# Patient Record
Sex: Female | Born: 1980 | Race: White | Hispanic: No | Marital: Married | Smoking: Never smoker
Health system: Southern US, Community
[De-identification: ages and names within clinical notes are randomized; demographics above are authoritative.]

## PROBLEM LIST (undated history)

## (undated) DIAGNOSIS — N840 Polyp of corpus uteri: Secondary | ICD-10-CM

## (undated) DIAGNOSIS — Z803 Family history of malignant neoplasm of breast: Secondary | ICD-10-CM

## (undated) DIAGNOSIS — J309 Allergic rhinitis, unspecified: Secondary | ICD-10-CM

## (undated) DIAGNOSIS — Z1371 Encounter for nonprocreative screening for genetic disease carrier status: Secondary | ICD-10-CM

## (undated) DIAGNOSIS — E039 Hypothyroidism, unspecified: Secondary | ICD-10-CM

## (undated) DIAGNOSIS — K219 Gastro-esophageal reflux disease without esophagitis: Secondary | ICD-10-CM

## (undated) DIAGNOSIS — Z8041 Family history of malignant neoplasm of ovary: Secondary | ICD-10-CM

## (undated) DIAGNOSIS — N923 Ovulation bleeding: Secondary | ICD-10-CM

## (undated) HISTORY — DX: Encounter for nonprocreative screening for genetic disease carrier status: Z13.71

## (undated) HISTORY — DX: Family history of malignant neoplasm of breast: Z80.3

## (undated) HISTORY — DX: Family history of malignant neoplasm of ovary: Z80.41

## (undated) HISTORY — PX: TONSILLECTOMY: SUR1361

## (undated) HISTORY — DX: Polyp of corpus uteri: N84.0

## (undated) HISTORY — DX: Allergic rhinitis, unspecified: J30.9

## (undated) HISTORY — DX: Ovulation bleeding: N92.3

---

## 2005-03-14 ENCOUNTER — Ambulatory Visit: Payer: Self-pay | Admitting: Obstetrics & Gynecology

## 2005-05-05 ENCOUNTER — Observation Stay: Payer: Self-pay | Admitting: Obstetrics & Gynecology

## 2005-05-20 ENCOUNTER — Inpatient Hospital Stay: Payer: Self-pay | Admitting: Unknown Physician Specialty

## 2006-02-28 ENCOUNTER — Emergency Department: Payer: Self-pay | Admitting: Emergency Medicine

## 2012-07-22 HISTORY — PX: COLPOSCOPY: SHX161

## 2014-09-12 ENCOUNTER — Emergency Department: Payer: Self-pay | Admitting: Emergency Medicine

## 2015-08-03 ENCOUNTER — Encounter
Admission: RE | Admit: 2015-08-03 | Discharge: 2015-08-03 | Disposition: A | Discharge status: Home / Self Care | Payer: BLUE CROSS/BLUE SHIELD | Source: Ambulatory Visit | Attending: Obstetrics and Gynecology | Admitting: Obstetrics and Gynecology

## 2015-08-03 ENCOUNTER — Other Ambulatory Visit: Payer: Self-pay

## 2015-08-03 DIAGNOSIS — Z01812 Encounter for preprocedural laboratory examination: Secondary | ICD-10-CM | POA: Diagnosis not present

## 2015-08-03 HISTORY — DX: Hypothyroidism, unspecified: E03.9

## 2015-08-03 HISTORY — DX: Gastro-esophageal reflux disease without esophagitis: K21.9

## 2015-08-03 LAB — CBC
HEMATOCRIT: 34.4 % — AB (ref 35.0–47.0)
Hemoglobin: 10.9 g/dL — ABNORMAL LOW (ref 12.0–16.0)
MCH: 23.6 pg — ABNORMAL LOW (ref 26.0–34.0)
MCHC: 31.7 g/dL — ABNORMAL LOW (ref 32.0–36.0)
MCV: 74.7 fL — AB (ref 80.0–100.0)
Platelets: 281 10*3/uL (ref 150–440)
RBC: 4.61 MIL/uL (ref 3.80–5.20)
RDW: 16.6 % — ABNORMAL HIGH (ref 11.5–14.5)
WBC: 8 10*3/uL (ref 3.6–11.0)

## 2015-08-03 NOTE — Patient Instructions (Signed)
    Your procedure is scheduled on: Thursday 08/10/15 Report to Day Surgery. 2ND FLOOR MEDICAL MALL ENTRANCE To find out your arrival time please call [phone] between 1PM - 3PM on Wednesday 08/09/15.  Remember: Instructions that are not followed completely may result in serious medical risk, up to and including death, or upon the discretion of your surgeon and anesthesiologist your surgery may need to be rescheduled.    __X__ 1. Do not eat food or drink liquids after midnight. No gum chewing or hard candies.     __X__ 2. No Alcohol for 24 hours before or after surgery.   ____ 3. Bring all medications with you on the day of surgery if instructed.    __X__ 4. Notify your doctor if there is any change in your medical condition     (cold, fever, infections).     Do not wear jewelry, make-up, hairpins, clips or nail polish.  Do not wear lotions, powders, or perfumes. You may wear deodorant.  Do not shave 48 hours prior to surgery. Men may shave face and neck.  Do not bring valuables to the hospital.    [facility] is not responsible for any belongings or valuables.               Contacts, dentures or bridgework may not be worn into surgery.  Leave your suitcase in the car. After surgery it may be brought to your room.  For patients admitted to the hospital, discharge time is determined by your                treatment team.   Patients discharged the day of surgery will not be allowed to drive home.   Please read over the following fact sheets that you were given:   Surgical Site Infection Prevention   __X__ Take these medicines the morning of surgery with A SIP OF WATER:    1. CETIRIZINE/ZYRTEC  2. LEVOTHYROXINE/SYNTHROID  3. RANITIDINE/ZANTAC  4.  5.  6.  ____ Fleet Enema (as directed)   ____ Use CHG Soap as directed  ____ Use inhalers on the day of surgery  ____ Stop metformin 2 days prior to surgery    ____ Take 1/2 of usual insulin dose the night before surgery and  none on the morning of surgery.   ____ Stop Coumadin/Plavix/aspirin on   ____ Stop Anti-inflammatories on TYLENOL ONLY FOR ACHES OR PAINS   ____ Stop supplements until after surgery.    ____ Bring C-Pap to the hospital.

## 2015-08-03 NOTE — Pre-Procedure Instructions (Signed)
PCR result +Staph, result faxed to Dr. Wyn Quakerew, verbal notification to Vernona RiegerLaura

## 2015-08-10 ENCOUNTER — Ambulatory Visit
Admission: RE | Admit: 2015-08-10 | Discharge: 2015-08-10 | Disposition: A | Discharge status: Home / Self Care | Payer: BLUE CROSS/BLUE SHIELD | Source: Ambulatory Visit | Attending: Obstetrics and Gynecology | Admitting: Obstetrics and Gynecology

## 2015-08-10 ENCOUNTER — Ambulatory Visit: Payer: BLUE CROSS/BLUE SHIELD | Admitting: Anesthesiology

## 2015-08-10 ENCOUNTER — Encounter
Admission: RE | Disposition: A | Discharge status: Home / Self Care | Payer: Self-pay | Source: Ambulatory Visit | Attending: Obstetrics and Gynecology

## 2015-08-10 DIAGNOSIS — E039 Hypothyroidism, unspecified: Secondary | ICD-10-CM | POA: Diagnosis not present

## 2015-08-10 DIAGNOSIS — K219 Gastro-esophageal reflux disease without esophagitis: Secondary | ICD-10-CM | POA: Insufficient documentation

## 2015-08-10 DIAGNOSIS — Z9889 Other specified postprocedural states: Secondary | ICD-10-CM

## 2015-08-10 DIAGNOSIS — N840 Polyp of corpus uteri: Secondary | ICD-10-CM | POA: Diagnosis present

## 2015-08-10 HISTORY — PX: HYSTEROSCOPY WITH D & C: SHX1775

## 2015-08-10 LAB — POCT PREGNANCY, URINE: PREG TEST UR: NEGATIVE

## 2015-08-10 SURGERY — DILATATION AND CURETTAGE /HYSTEROSCOPY
Anesthesia: General | Wound class: Clean Contaminated

## 2015-08-10 MED ORDER — DEXAMETHASONE SODIUM PHOSPHATE 10 MG/ML IJ SOLN
INTRAMUSCULAR | Status: DC | PRN
  Administered 2015-08-10: 10 mg via INTRAVENOUS

## 2015-08-10 MED ORDER — FENTANYL CITRATE (PF) 100 MCG/2ML IJ SOLN: 25.0000 ug | INTRAMUSCULAR | Status: DC | PRN

## 2015-08-10 MED ORDER — OXYCODONE HCL 5 MG/5ML PO SOLN: 5.0000 mg | Freq: Once | ORAL | Status: DC | PRN

## 2015-08-10 MED ORDER — PROPOFOL 10 MG/ML IV BOLUS
INTRAVENOUS | Status: DC | PRN
  Administered 2015-08-10: 150 mg via INTRAVENOUS

## 2015-08-10 MED ORDER — FENTANYL CITRATE (PF) 100 MCG/2ML IJ SOLN
INTRAMUSCULAR | Status: DC | PRN
  Administered 2015-08-10: 50 ug via INTRAVENOUS

## 2015-08-10 MED ORDER — MIDAZOLAM HCL 2 MG/2ML IJ SOLN
INTRAMUSCULAR | Status: DC | PRN
Start: 2015-08-10 — End: 2015-08-10
  Administered 2015-08-10: 2 mg via INTRAVENOUS

## 2015-08-10 MED ORDER — HYDROCODONE-ACETAMINOPHEN 5-325 MG PO TABS: 1.0000 | ORAL_TABLET | Freq: Four times a day (QID) | ORAL | Status: DC | PRN

## 2015-08-10 MED ORDER — EPHEDRINE SULFATE 50 MG/ML IJ SOLN
INTRAMUSCULAR | Status: DC | PRN
  Administered 2015-08-10: 10 mg via INTRAVENOUS

## 2015-08-10 MED ORDER — LIDOCAINE HCL (CARDIAC) 20 MG/ML IV SOLN
INTRAVENOUS | Status: DC | PRN
  Administered 2015-08-10: 60 mg via INTRAVENOUS

## 2015-08-10 MED ORDER — IBUPROFEN 600 MG PO TABS: 600.0000 mg | ORAL_TABLET | Freq: Four times a day (QID) | ORAL | Status: DC | PRN

## 2015-08-10 MED ORDER — OXYCODONE HCL 5 MG PO TABS: 5.0000 mg | ORAL_TABLET | Freq: Once | ORAL | Status: DC | PRN

## 2015-08-10 MED ORDER — LACTATED RINGERS IV SOLN
INTRAVENOUS | Status: DC
  Administered 2015-08-10: 09:00:00 via INTRAVENOUS

## 2015-08-10 MED ORDER — ONDANSETRON HCL 4 MG/2ML IJ SOLN
INTRAMUSCULAR | Status: DC | PRN
  Administered 2015-08-10: 4 mg via INTRAVENOUS

## 2015-08-10 SURGICAL SUPPLY — 18 items
CATH ROBINSON RED A/P 16FR (CATHETERS) ×3 IMPLANT
ELECT REM PT RETURN 9FT ADLT (ELECTROSURGICAL) ×3
ELECTRODE REM PT RTRN 9FT ADLT (ELECTROSURGICAL) ×1 IMPLANT
GLOVE BIO SURGEON STRL SZ7 (GLOVE) ×9 IMPLANT
GLOVE INDICATOR 7.5 STRL GRN (GLOVE) ×9 IMPLANT
GOWN STRL REUS W/ TWL LRG LVL3 (GOWN DISPOSABLE) ×2 IMPLANT
GOWN STRL REUS W/TWL LRG LVL3 (GOWN DISPOSABLE) ×4
IV LACTATED RINGERS 1000ML (IV SOLUTION) ×3 IMPLANT
KIT RM TURNOVER CYSTO AR (KITS) ×3 IMPLANT
MYOSURE LITE POLYP REMOVAL (MISCELLANEOUS) ×3 IMPLANT
PACK DNC HYST (MISCELLANEOUS) ×3 IMPLANT
PAD OB MATERNITY 4.3X12.25 (PERSONAL CARE ITEMS) ×3 IMPLANT
PAD PREP 24X41 OB/GYN DISP (PERSONAL CARE ITEMS) ×3 IMPLANT
SOL .9 NS 3000ML IRR  AL (IV SOLUTION) ×2
SOL .9 NS 3000ML IRR UROMATIC (IV SOLUTION) ×1 IMPLANT
TOWEL OR 17X26 4PK STRL BLUE (TOWEL DISPOSABLE) ×3 IMPLANT
TUBING CONNECTING 10 (TUBING) ×2 IMPLANT
TUBING CONNECTING 10' (TUBING) ×1

## 2015-08-10 NOTE — Discharge Instructions (Signed)

## 2015-08-10 NOTE — Op Note (Signed)
Patient Name: Kelly Villanueva Date of Procedure: 08/10/2015   Preoperative Diagnosis: 1) 35 y.o. with endometrial polyp  Postoperative Diagnosis: 1) 35 y.o. with endometrial polyp  Operation Performed: Hysteroscopy, targeted dilation and curettage polypectomy with myosure  Indication: 35 year old who on in office evaluation for irregular menses noted to have endometrial polyp on imaging  Anesthesia:General  Primary Surgeon: Vena Austria, MD  Assistant: none  Preoperative Antibiotics: none  Estimated Blood Loss: minimal  IV Fluids:  Urine Output:: ~84mL straiggt cath  Drains or Tubes: none  Implants: none  Specimens Removed: endometrial curettings and polyp  Complications: none  Intraoperative Findings:  Small 1cm polyp arising from the mid left lateral aspect of the uterine cavity  Patient Condition: stable  Procedure in Detail:  Patient was taken to the operating room were she was administered general endotracheal anesthesia.  She was positioned in the dorsal lithotomy position utilizing Allen stirups, prepped and draped in the usual sterile fashion.  Uterus was noted to be non-enlarged in size, retroverted.   Prior to proceeding with the case a time out was performed.  Attention was turned to the patient's pelvis.  A red rubber catheter was used to empty the patient's bladder.  An operative speculum was placed to allow visualization of the cervix.  The anterior lip of the cervix was grasped with a single tooth tenaculum and the cervix was sequentially dilated using pratt dilators.  The hysteroscope was then advanced into the uterine cavity noting the above findings.  The myosure system was used to shave off the visualized polyp, the remainder of the cavity was normal in contour.  Targeted curettage was performed ausing the my0sure system to sample the remainder of the uterine lining. The single tooth tenaculum was removed from the cervix.  The tenaculum sites  and cervix were noted to be  Hemostatic before removing the operative speculum.  Sponge needle and instrument counts were corrects times two.  The patient tolerated the procedure well and was taken to the recovery room in stable condition.

## 2015-08-10 NOTE — Anesthesia Postprocedure Evaluation (Signed)
Anesthesia Post Note  Patient: Kelly Villanueva  Procedure(s) Performed: Procedure(s) (LRB): DILATATION AND CURETTAGE /HYSTEROSCOPY with polypectomy/myosure (N/A)  Patient location during evaluation: PACU Anesthesia Type: General Level of consciousness: awake and alert Pain management: pain level controlled Vital Signs Assessment: post-procedure vital signs reviewed and stable Respiratory status: spontaneous breathing, nonlabored ventilation, respiratory function stable and patient connected to nasal cannula oxygen Cardiovascular status: blood pressure returned to baseline and stable Postop Assessment: no signs of nausea or vomiting Anesthetic complications: no    Last Vitals:  Filed Vitals:   08/10/15 1023 08/10/15 1032  BP: 115/76 119/72  Pulse: 91 81  Temp: 36.8 C 36.5 C  Resp: 24 18    Last Pain: There were no vitals filed for this visit.               Cleda Mccreedy Piscitello

## 2015-08-10 NOTE — Transfer of Care (Signed)
Immediate Anesthesia Transfer of Care Note  Patient: Kelly Villanueva  Procedure(s) Performed: Procedure(s): DILATATION AND CURETTAGE /HYSTEROSCOPY with polypectomy/myosure (N/A)  Patient Location: PACU  Anesthesia Type:General  Level of Consciousness: awake, alert  and oriented  Airway & Oxygen Therapy: Patient Spontanous Breathing and Patient connected to face mask oxygen  Post-op Assessment: Report given to RN and Post -op Vital signs reviewed and stable  Post vital signs: Reviewed and stable  Last Vitals: 100% sat 119/79 bp 18resp 117hr 98.3temp Filed Vitals:   08/10/15 0827  BP: 125/76  Pulse: 84  Temp: 36.3 C  Resp: 16    Complications: No apparent anesthesia complications

## 2015-08-10 NOTE — Pre-Procedure Instructions (Signed)
PCR result +Staph, result faxed to Dr. Wyn Quaker, verbal notification to Vernona Rieger Addendum: note entered on wrong patient

## 2015-08-10 NOTE — Anesthesia Procedure Notes (Signed)
Procedure Name: LMA Insertion Date/Time: 08/10/2015 8:55 AM Performed by: Chong Sicilian Pre-anesthesia Checklist: Patient identified, Emergency Drugs available, Suction available, Patient being monitored and Timeout performed Patient Re-evaluated:Patient Re-evaluated prior to inductionOxygen Delivery Method: Circle system utilized and Simple face mask Preoxygenation: Pre-oxygenation with 100% oxygen Intubation Type: IV induction Ventilation: Mask ventilation without difficulty LMA Size: 3.5 Placement Confirmation: breath sounds checked- equal and bilateral and positive ETCO2 Tube secured with: Tape

## 2015-08-10 NOTE — Anesthesia Preprocedure Evaluation (Signed)
Anesthesia Evaluation  Patient identified by MRN, date of birth, ID band Patient awake    Reviewed: Allergy & Precautions, H&P , NPO status , Patient's Chart, lab work & pertinent test results  History of Anesthesia Complications Negative for: history of anesthetic complications  Airway Mallampati: II  TM Distance: >3 FB Neck ROM: full    Dental  (+) Poor Dentition   Pulmonary neg pulmonary ROS, neg shortness of breath,    Pulmonary exam normal breath sounds clear to auscultation       Cardiovascular Exercise Tolerance: Good (-) angina(-) Past MI and (-) DOE negative cardio ROS Normal cardiovascular exam Rhythm:regular Rate:Normal     Neuro/Psych negative neurological ROS  negative psych ROS   GI/Hepatic Neg liver ROS, GERD  Controlled and Medicated,  Endo/Other  Hypothyroidism   Renal/GU negative Renal ROS  negative genitourinary   Musculoskeletal   Abdominal   Peds  Hematology negative hematology ROS (+)   Anesthesia Other Findings Past Medical History:   Hypothyroidism                                               GERD (gastroesophageal reflux disease)                      Past Surgical History:   TONSILLECTOMY                                                BMI    Body Mass Index   31.00 kg/m 2    Signs and symptoms suggestive of sleep apnea   Patient has abrasion on top lip  Reproductive/Obstetrics negative OB ROS                             Anesthesia Physical Anesthesia Plan  ASA: III  Anesthesia Plan: General LMA   Post-op Pain Management:    Induction:   Airway Management Planned:   Additional Equipment:   Intra-op Plan:   Post-operative Plan:   Informed Consent: I have reviewed the patients History and Physical, chart, labs and discussed the procedure including the risks, benefits and alternatives for the proposed anesthesia with the patient or authorized  representative who has indicated his/her understanding and acceptance.   Dental Advisory Given  Plan Discussed with: Anesthesiologist, CRNA and Surgeon  Anesthesia Plan Comments:         Anesthesia Quick Evaluation

## 2015-08-10 NOTE — H&P (Signed)
  Date of Initial H&P: 07/11/2015  History reviewed, patient examined, no change in status, stable for surgery. 

## 2015-08-10 NOTE — Pre-Procedure Instructions (Deleted)
Addendum: note dated 08/03/15 entered on wrong patient

## 2015-08-11 LAB — SURGICAL PATHOLOGY

## 2017-03-22 DIAGNOSIS — Z1371 Encounter for nonprocreative screening for genetic disease carrier status: Secondary | ICD-10-CM

## 2017-03-22 HISTORY — DX: Encounter for nonprocreative screening for genetic disease carrier status: Z13.71

## 2017-03-30 NOTE — Progress Notes (Addendum)
Gynecology Annual Exam  PCP: Albina Billet, MD  Chief Complaint:  Chief Complaint  Patient presents with  . Gynecologic Exam    History of Present Illness:Kelly Villanueva is a 36 year old Caucasian/White female, G3 P3003, who presents for her gynecological  exam. She continues to have intermenstrual spotting the week before her menses starts. The spotting is more of a brown discharge that she sees when wiping. Her spotting has decreased since having a hysteroscopy and endometrial polypectomy Jan 2017. The pathology was benign. SHe will occsionally have postcoital spotting. Her menses are regular and her LMP was 03/18/2017. They occur every 1 month, they last 7 days, are medium flow, and are without clots.    She has not had dysmenorrhea. The patient's past medical history is notable for a history of hypothyroidism and has been followed by PCP Dr Hall Busing. \  Her last annual GYN exam was 02/24/2015. She is sexually active. She is currently using a vasectomy for contraception.  Her most recent pap smear was obtained 02/24/2015 and was NIL.  She has not had a recent mammogram and is eligible.  There is a positive history of breast cancer in her maternal grandmother.  There is a family history of ovarian cancer in her paternal grandmother . Genetic testing has been done. Patient reports that her sister had genetic testing done and it was positive for mutations increasing the risk of pancreatic cancer.  The patient does do occasional self breast exams.  The patient does not smoke.  The patient does not drink alcohol.  The patient does not use illegal drugs.  The patient exercises regularly.  The patient does get adequate calcium in her diet.  She has had a recent cholesterol screen 2018 and it was noraml.    The patient denies current symptoms of depression.    Review of Systems: Review of Systems  Constitutional: Negative for chills, fever and weight loss.  HENT: Negative for  congestion, sinus pain and sore throat.   Eyes: Negative for blurred vision and pain.  Respiratory: Negative for hemoptysis, shortness of breath and wheezing.   Cardiovascular: Negative for chest pain, palpitations and leg swelling.  Gastrointestinal: Negative for abdominal pain, blood in stool, diarrhea, heartburn, nausea and vomiting.  Genitourinary: Negative for dysuria, frequency, hematuria and urgency.       Positive for intermenstrual spotting  Musculoskeletal: Negative for back pain, joint pain and myalgias.  Skin: Negative for itching and rash.  Neurological: Negative for dizziness, tingling and headaches.  Endo/Heme/Allergies: Negative for environmental allergies and polydipsia. Does not bruise/bleed easily.       Negative for hirsutism   Psychiatric/Behavioral: Negative for depression. The patient is not nervous/anxious and does not have insomnia.     Past Medical History:  Past Medical History:  Diagnosis Date  . Allergic rhinitis   . Endometrial polyp   . Family history of breast cancer   . Family history of ovarian cancer   . GERD (gastroesophageal reflux disease)   . Hypothyroidism   . Intermenstrual bleeding     Past Surgical History:  Past Surgical History:  Procedure Laterality Date  . COLPOSCOPY  2014  . HYSTEROSCOPY W/D&C N/A 08/10/2015   Procedure: DILATATION AND CURETTAGE /HYSTEROSCOPY with polypectomy/myosure;  Surgeon: Malachy Mood, MD;  Location: [facility] ORS;  Service: Gynecology;  Laterality: N/A;  . TONSILLECTOMY      Family History:  Family History  Problem Relation Age of Onset  . Thyroid disease Mother   .  Anemia Mother   . Arthritis/Rheumatoid Mother   . Thyroid disease Maternal Aunt   . Arthritis/Rheumatoid Maternal Aunt   . Breast cancer Maternal Grandmother 40       recurrence in her 99s  . Thyroid disease Maternal Grandmother   . Diabetes Maternal Grandmother   . Melanoma Maternal Grandmother   . Heart attack Maternal Grandmother     . Stroke Maternal Grandmother   . Cancer Paternal Grandmother 103       ovarian  . Diabetes Paternal Grandfather   . Hypertension Father   . Hyperlipidemia Father   . Melanoma Father   . Multiple myeloma Maternal Grandfather     Social History:  Social History   Social History  . Marital status: Married    Spouse name: Josh  . Number of children: 3  . Years of education: N/A   Occupational History  . Teacher    Social History Main Topics  . Smoking status: Never Smoker  . Smokeless tobacco: Never Used  . Alcohol use No  . Drug use: No  . Sexual activity: Yes    Birth control/ protection: Surgical     Comment: vasectomy   Other Topics Concern  . Not on file   Social History Narrative  . No narrative on file    Allergies:  No Known Allergies  Medications: Prior to Admission medications   Medication Sig Start Date End Date Taking? Authorizing Provider  cetirizine (ZYRTEC) 10 MG tablet Take 10 mg by mouth daily. Reported on 08/10/2015    [provider]                levothyroxine (SYNTHROID, LEVOTHROID) 100 MCG tablet Take 100 mcg by mouth daily before breakfast.    [provider]  ranitidine (ZANTAC) 150 MG tablet Take 150 mg by mouth daily as needed for heartburn.    [provider]  Flonase prn  Physical Exam Vitals: BP 110/80   Pulse (!) 108   Ht _0  (1.6 m)   Wt 176 lb (79.8 kg)   LMP 03/17/2017   BMI 31.18 kg/m   General: WF in NAD HEENT: normocephalic, anicteric Neck: no thyroid enlargement, no palpable nodules, no cervical lymphadenopathy  Pulmonary: No increased work of breathing, CTAB Cardiovascular: RRR, without murmur  Breast: Breast symmetrical, no tenderness, no palpable nodules or masses, no skin or nipple retraction present, no nipple discharge.  No axillary, infraclavicular or supraclavicular lymphadenopathy. Abdomen: Soft, non-tender, non-distended.  Umbilicus without lesions.  No hepatomegaly or masses  palpable. No evidence of hernia. Genitourinary:  External: Normal external female genitalia.  Normal urethral meatus, normal Bartholin's and Skene's glands.    Vagina: Normal vaginal mucosa, no evidence of prolapse.    Cervix: friable area at 5-6 o'clock-treated with silver nitrate,  non-tender  Uterus: Anteverted, normal size, shape, and consistency, mobile, and non-tender  Adnexa: No adnexal masses, non-tender  Rectal: deferred  Lymphatic: no evidence of inguinal lymphadenopathy Extremities: no edema, erythema, or tenderness Neurologic: Grossly intact Psychiatric: mood appropriate, affect full     Assessment: 36 y.o. G3 P3 annual gyn exam Family history of ovarian and breast cancer-desires genetic testing Intermenstrual spotting/ friable cervix  Plan:   1) Breast cancer screening - recommend monthly self breast exam and will probably need to start breast imaging Will do genetic testing and have patient return in 6 weeks for results and TC model and recommendations for imaging.Marland Kitchen  2) Cervical cancer screening - Pap was done. ASCCP guidelines  and rational discussed.  Patient opts for yearly screening interval  3) Contraception - vasectomy  4) Routine healthcare maintenance including cholesterol and diabetes screening managed by PCP   Dalia Heading, CNM

## 2017-03-31 ENCOUNTER — Encounter: Payer: Self-pay | Admitting: Certified Nurse Midwife

## 2017-03-31 ENCOUNTER — Ambulatory Visit (INDEPENDENT_AMBULATORY_CARE_PROVIDER_SITE_OTHER): Payer: BLUE CROSS/BLUE SHIELD | Admitting: Certified Nurse Midwife

## 2017-03-31 VITALS — BP 110/80 | HR 108 | Ht 63.0 in | Wt 176.0 lb

## 2017-03-31 DIAGNOSIS — Z803 Family history of malignant neoplasm of breast: Secondary | ICD-10-CM | POA: Diagnosis not present

## 2017-03-31 DIAGNOSIS — N923 Ovulation bleeding: Secondary | ICD-10-CM | POA: Diagnosis not present

## 2017-03-31 DIAGNOSIS — N840 Polyp of corpus uteri: Secondary | ICD-10-CM

## 2017-03-31 DIAGNOSIS — Z8041 Family history of malignant neoplasm of ovary: Secondary | ICD-10-CM | POA: Diagnosis not present

## 2017-03-31 DIAGNOSIS — Z01419 Encounter for gynecological examination (general) (routine) without abnormal findings: Secondary | ICD-10-CM | POA: Diagnosis not present

## 2017-03-31 DIAGNOSIS — Z124 Encounter for screening for malignant neoplasm of cervix: Secondary | ICD-10-CM

## 2017-04-04 LAB — IGP, APTIMA HPV
HPV Aptima: NEGATIVE
PAP SMEAR COMMENT: 0

## 2017-04-06 ENCOUNTER — Encounter: Payer: Self-pay | Admitting: Certified Nurse Midwife

## 2017-04-06 DIAGNOSIS — E039 Hypothyroidism, unspecified: Secondary | ICD-10-CM | POA: Insufficient documentation

## 2017-04-06 DIAGNOSIS — Z803 Family history of malignant neoplasm of breast: Secondary | ICD-10-CM | POA: Insufficient documentation

## 2017-04-06 DIAGNOSIS — J309 Allergic rhinitis, unspecified: Secondary | ICD-10-CM | POA: Insufficient documentation

## 2017-04-06 DIAGNOSIS — Z8041 Family history of malignant neoplasm of ovary: Secondary | ICD-10-CM | POA: Insufficient documentation

## 2017-04-06 DIAGNOSIS — N840 Polyp of corpus uteri: Secondary | ICD-10-CM | POA: Insufficient documentation

## 2017-04-06 DIAGNOSIS — K219 Gastro-esophageal reflux disease without esophagitis: Secondary | ICD-10-CM | POA: Insufficient documentation

## 2017-04-06 DIAGNOSIS — N923 Ovulation bleeding: Secondary | ICD-10-CM | POA: Insufficient documentation

## 2017-04-07 ENCOUNTER — Encounter: Payer: Self-pay | Admitting: Certified Nurse Midwife

## 2017-04-14 ENCOUNTER — Encounter: Payer: Self-pay | Admitting: Obstetrics and Gynecology

## 2017-05-12 ENCOUNTER — Ambulatory Visit (INDEPENDENT_AMBULATORY_CARE_PROVIDER_SITE_OTHER): Payer: BLUE CROSS/BLUE SHIELD | Admitting: Certified Nurse Midwife

## 2017-05-12 ENCOUNTER — Encounter: Payer: Self-pay | Admitting: Certified Nurse Midwife

## 2017-05-12 VITALS — BP 124/82 | HR 91 | Ht 63.0 in | Wt 169.0 lb

## 2017-05-12 DIAGNOSIS — Z803 Family history of malignant neoplasm of breast: Secondary | ICD-10-CM | POA: Diagnosis not present

## 2017-05-12 DIAGNOSIS — Z7183 Encounter for nonprocreative genetic counseling: Secondary | ICD-10-CM | POA: Diagnosis not present

## 2017-05-12 DIAGNOSIS — Z8041 Family history of malignant neoplasm of ovary: Secondary | ICD-10-CM

## 2017-05-18 NOTE — Progress Notes (Signed)
Obstetrics & Gynecology Office Visit   Chief Complaint:  Chief Complaint  Patient presents with  . Follow-up    myriad test results    History of Present Illness: 36 year old WF who presents for counseling on results after having MYRISK testing because of her family history of ovarian cancerIn her paternal grandmother and breast cancer in her 17 yo maternal grandmother. The test returned negative for any significant genetic mutations that would increase her rsik of cancer. 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 Her lifetime risk of breast cancer is 17.5%   Review of Systems:  ROS  NA  Past Medical History:  Past Medical History:  Diagnosis Date  . Allergic rhinitis   . BRCA negative 03/2017   MyRisk neg  . Endometrial polyp   . Family history of breast cancer    IBIS=16.6%/riskscore=17.5%  . Family history of ovarian cancer    PGM  . GERD (gastroesophageal reflux disease)   . Hypothyroidism   . Intermenstrual bleeding     Past Surgical History:  Past Surgical History:  Procedure Laterality Date  . COLPOSCOPY  2014  . HYSTEROSCOPY W/D&C N/A 08/10/2015   Procedure: DILATATION AND CURETTAGE /HYSTEROSCOPY with polypectomy/myosure;  Surgeon: Malachy Mood, MD;  Location: [facility] ORS;  Service: Gynecology;  Laterality: N/A;  . TONSILLECTOMY      Gynecologic History: Patient's last menstrual period was 05/05/2017 (exact date).  Obstetric History: [id number]  Family History:  Family History  Problem Relation Age of Onset  . Thyroid disease Mother   . Anemia Mother   . Arthritis/Rheumatoid Mother   . Thyroid disease Maternal Aunt   . Arthritis/Rheumatoid Maternal Aunt   . Breast cancer Maternal Grandmother 40       recurrence in her 45s  . Thyroid disease Maternal Grandmother   . Diabetes Maternal Grandmother   . Melanoma Maternal Grandmother   . Heart attack Maternal Grandmother   . Stroke Maternal Grandmother   . Cancer Paternal Grandmother 14    ovarian  . Diabetes Paternal Grandfather   . Hypertension Father   . Hyperlipidemia Father   . Melanoma Father   . Multiple myeloma Maternal Grandfather     Social History:  Social History   Social History  . Marital status: Married    Spouse name: Josh  . Number of children: 3  . Years of education: N/A   Occupational History  . Teacher    Social History Main Topics  . Smoking status: Never Smoker  . Smokeless tobacco: Never Used  . Alcohol use No  . Drug use: No  . Sexual activity: Yes    Birth control/ protection: Surgical     Comment: vasectomy   Other Topics Concern  . Not on file   Social History Narrative  . No narrative on file    Allergies:  No Known Allergies  Medications: Prior to Admission medications   Medication Sig Start Date End Date Taking? Authorizing Provider  cetirizine (ZYRTEC) 10 MG tablet Take 10 mg by mouth daily. Reported on 08/10/2015   Yes [provider]  fluticasone (FLONASE) 50 MCG/ACT nasal spray Place 2 sprays into the nose daily.   Yes [provider]  levothyroxine (SYNTHROID, LEVOTHROID) 100 MCG tablet Take 100 mcg by mouth daily before breakfast.   Yes [provider]    Physical Exam Vitals:  Vitals:   05/12/17 1410  BP: 124/82  Pulse: 91   Patient's last menstrual period was 05/05/2017 (  exact date).  Physical Exam WF in NAD  Assessment: 36 y.o. [id number] with negative MYRISK testing  Plan: Recommend annual professional breast exam and monthly SBE. Start annual mammograms at age 26. Given copy of results for her records. RTO in 1 year for annual  10 minutes was spent in face to face counseling of test results, answering any questions, and giving recommendations for future care Dalia Heading, CNM

## 2020-09-19 ENCOUNTER — Other Ambulatory Visit: Payer: Self-pay | Admitting: Certified Nurse Midwife

## 2020-09-19 DIAGNOSIS — Z1231 Encounter for screening mammogram for malignant neoplasm of breast: Secondary | ICD-10-CM

## 2020-10-10 ENCOUNTER — Other Ambulatory Visit: Payer: Self-pay

## 2020-10-10 ENCOUNTER — Ambulatory Visit
Admission: RE | Admit: 2020-10-10 | Discharge: 2020-10-10 | Disposition: A | Discharge status: Home / Self Care | Payer: BC Managed Care – PPO | Source: Ambulatory Visit | Attending: Certified Nurse Midwife | Admitting: Certified Nurse Midwife

## 2020-10-10 DIAGNOSIS — Z1231 Encounter for screening mammogram for malignant neoplasm of breast: Secondary | ICD-10-CM | POA: Diagnosis present

## 2021-08-09 ENCOUNTER — Other Ambulatory Visit: Payer: Self-pay

## 2021-08-09 ENCOUNTER — Encounter: Payer: Self-pay | Admitting: Dietician

## 2021-08-09 ENCOUNTER — Encounter: Payer: BC Managed Care – PPO | Attending: Gastroenterology | Admitting: Dietician

## 2021-08-09 VITALS — Ht 63.0 in | Wt 190.0 lb

## 2021-08-09 DIAGNOSIS — K2970 Gastritis, unspecified, without bleeding: Secondary | ICD-10-CM

## 2021-08-09 DIAGNOSIS — K219 Gastro-esophageal reflux disease without esophagitis: Secondary | ICD-10-CM

## 2021-08-09 NOTE — Progress Notes (Signed)
Medical Nutrition Therapy: Visit start time: 1640  end time: 1750  Assessment:  Diagnosis: gastritis, lactose intolerance Past medical history: hypothyroidism Psychosocial issues/ stress concerns: reports high stress level, walks to help manage stress  Preferred learning method:  Auditory Hands-on   Current weight: 190.0lbs Height: 5'3" BMI: 33.7 Medications, supplements: reconciled list in medical record  Progress and evaluation:  Patient reports experiencing intermiitent abdominal pain upper left. She is no longer taking antacid meds as she did not notice any improvement.  Does not have any nausea or vomiting or other symptoms, mostly dull ache  She reports GI distress after consuming dairy products in the evening, mostly cheese, but is able to tolerate better earlier in the day.  She has tried multiple fad diets in the past, and with hypothyroidism, has difficulty losing weight. She is currently working more on overall healthy eating habits and resolution of abdominal pain vs weight loss plan.   Physical activity: walking 15 minutes, 5x a week  Dietary Intake:  Usual eating pattern includes 2-3 meals and 0-1 snacks per day. Dining out frequency: 2-3 meals per week.  Breakfast: first drinks coffee with skinny syrup and 1/2 and 1/2; [place] yogurt about 9:30am Snack: none Lunch: sometimes skips; sometimes 1-1:30pm -- chicken salad, crackers, clementine last week; this week [place] caviar with tortilla chips; lobster bisque today; usually small meal due to limited time Snack: none Supper: husband cooks -- grilled chicken and veg; shrimp and pasta Snack: sometimes [place] peanut bar; banana with nutella, peanut butter Beverages: water, sparkling water, occ diet Mt Dew; coffee in am, 1 glass wine  Nutrition Care Education: Topics covered:  Basic nutrition: basic food groups, appropriate nutrient balance, appropriate meal and snack schedule, general nutrition guidelines     Gastritis: low fat eating pattern; consuming high fiber foods; Mediterranean eating pattern with emphasis on vegetables, fruits, beans, nuts/ seeds, fish/ poultry vs regular intake of red meats; avoiding long periods of time between meals/ snacks; possible effects of caffeine, alcohol on acid production Lactose/ dairy: effects of dairy foods on acid production; dairy alternatives; discussed consideration of calcium supplement Other: whole grain foods and health benefits; discussed gluten and lack of necessity to follow gluten free pattern unless GI symptoms improve when doing so.   Nutritional Diagnosis:  Rachel-1.4 Altered GI function As related to gastritis, GERD.  As evidenced by patient reported symptoms of recurring abdominal pain.  Intervention:  Instruction and discussion as noted above. Patient tends to eat small portions, but often goes 8 hours during the day without eating. She will plan to include a lunchtime meal regularly, but feels this will be a challenge.  Established nutrition goals with direction from patient. No MNT follow up scheduled at this time; patient will schedule later if needed.  Education Materials given:  GERD nutrition therapy (NCM) Get Healthy with Mediterranean Style Eating Visit summary with goals/ instructions to be viewed via MyChart   Learner/ who was taught:  Patient   Level of understanding: Verbalizes/ demonstrates competency   Demonstrated degree of understanding via:   Teach back Learning barriers: None  Willingness to learn/ readiness for change: Eager, change in progress    Monitoring and Evaluation:  Dietary intake, GI symptoms, and body weight      follow up: prn

## 2021-08-09 NOTE — Patient Instructions (Signed)
Work towards eating a Mediterranean pattern, choosing mostly chicken/ turkey/ fish/ seafood for animal proteins, increasing vegetables and whole grains, and continue with eating fruits several times a day.  Avoid long stretches of time (more than 5 hours) with an empty stomach, eat a small meal or snack with some starch or fruit + protein when time is limited.

## 2021-11-06 IMAGING — MG MM DIGITAL SCREENING BILAT W/ TOMO AND CAD
8 series · 8 of 24 positions shown · non-contrast
Comparison: None.

CLINICAL DATA: Screening.

EXAM:
DIGITAL SCREENING BILATERAL MAMMOGRAM WITH TOMOSYNTHESIS AND CAD
TECHNIQUE: Bilateral screening digital craniocaudal and mediolateral oblique
mammograms were obtained. Bilateral screening digital breast
tomosynthesis was performed. The images were evaluated with
computer-aided detection.

[L CC synth-2D]
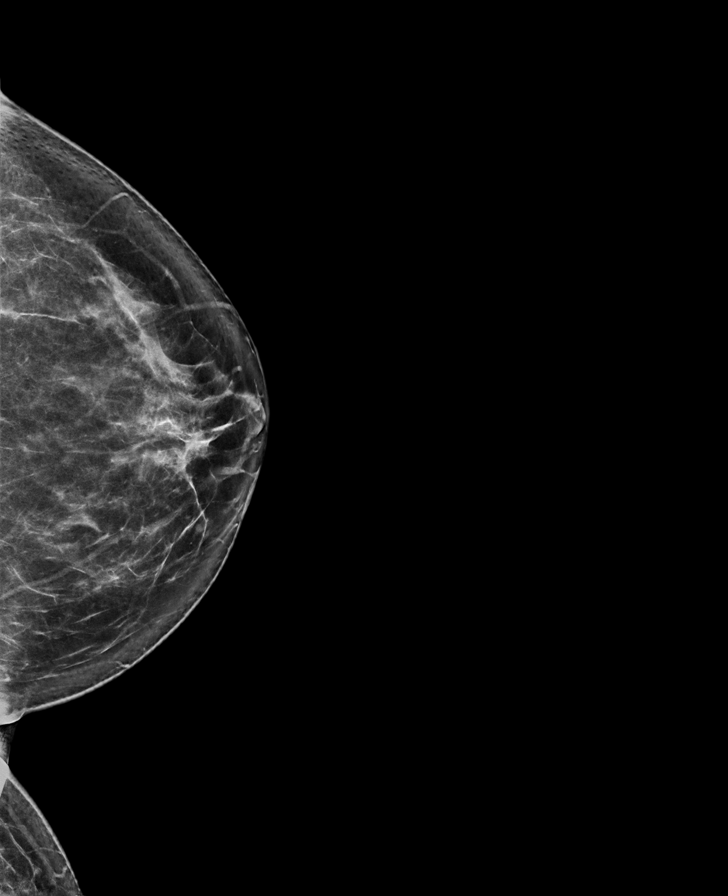

[R CC synth-2D]
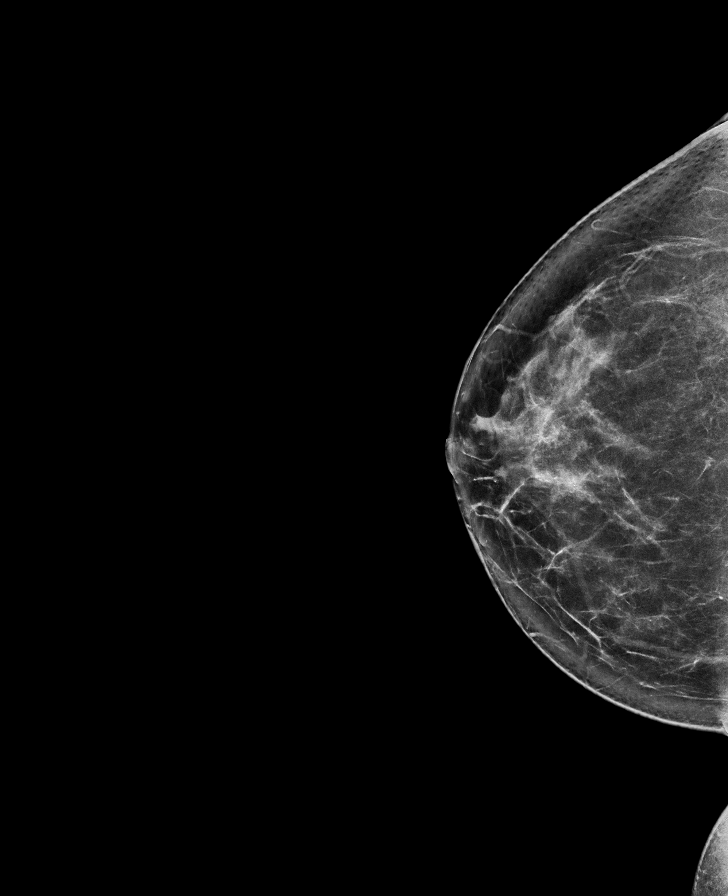

[R MLO synth-2D]
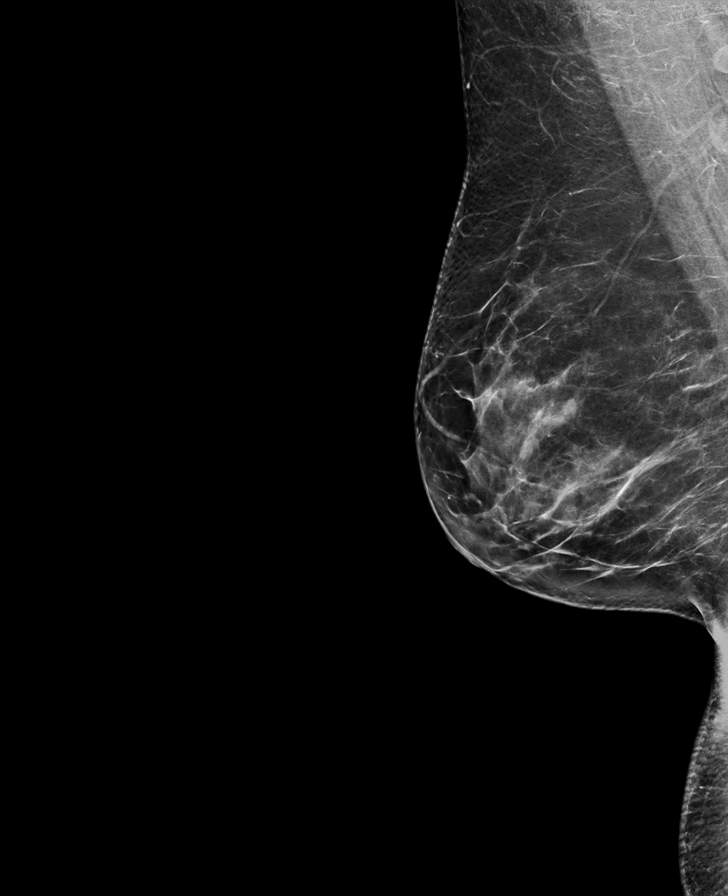

[L MLO synth-2D]
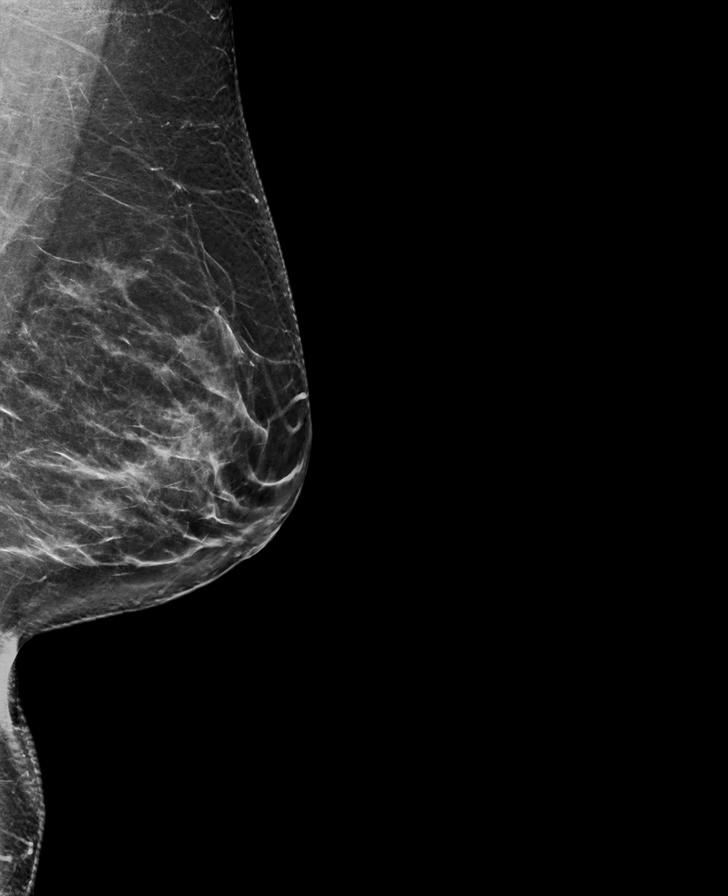

[R CC tomo · tomo slice 41/81.0]
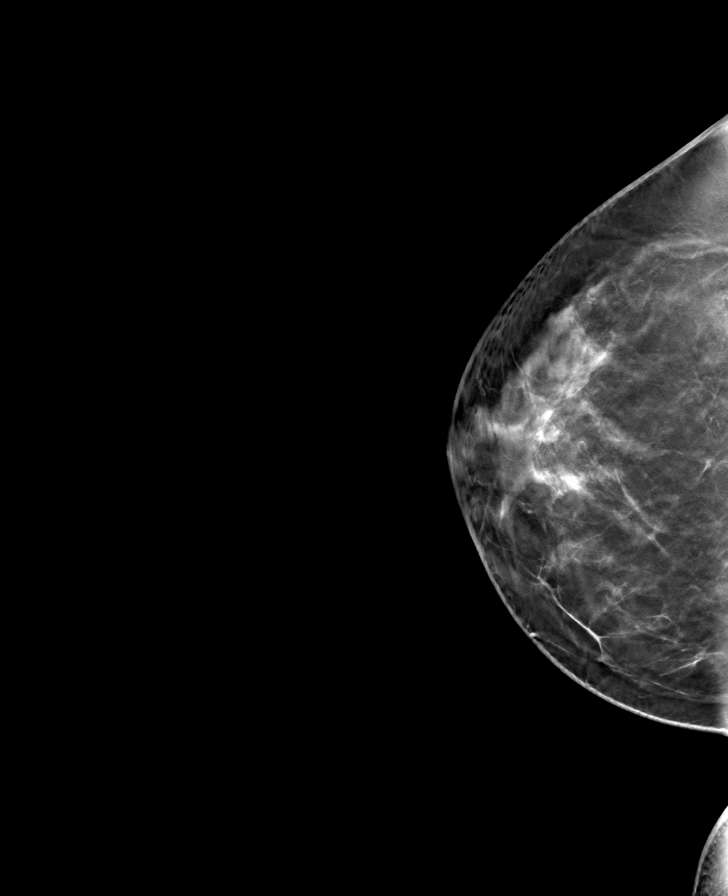

[R MLO tomo · tomo slice 43/84.0]
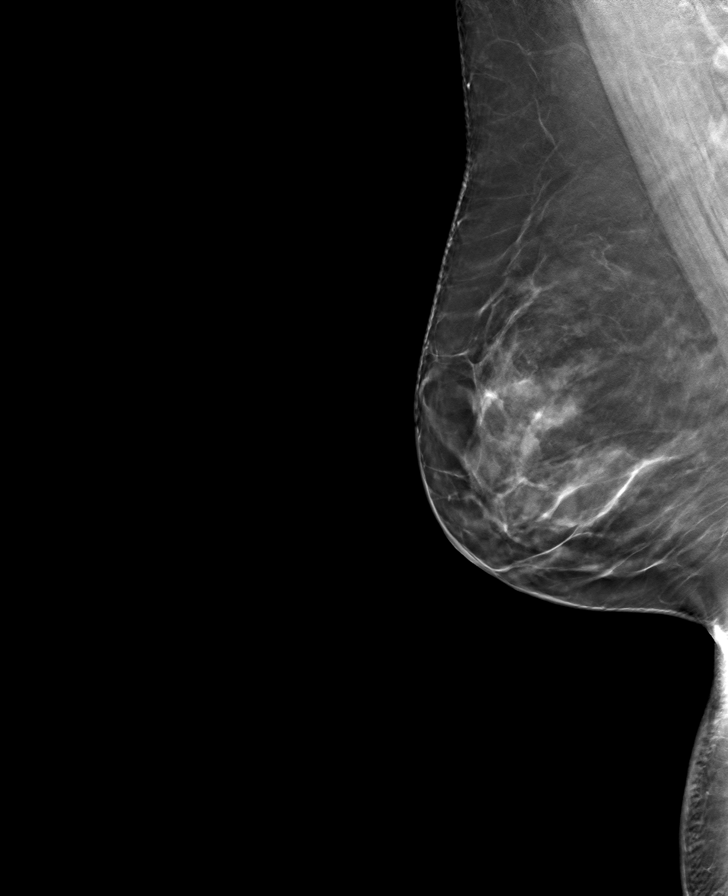

[L CC tomo · tomo slice 41/82.0]
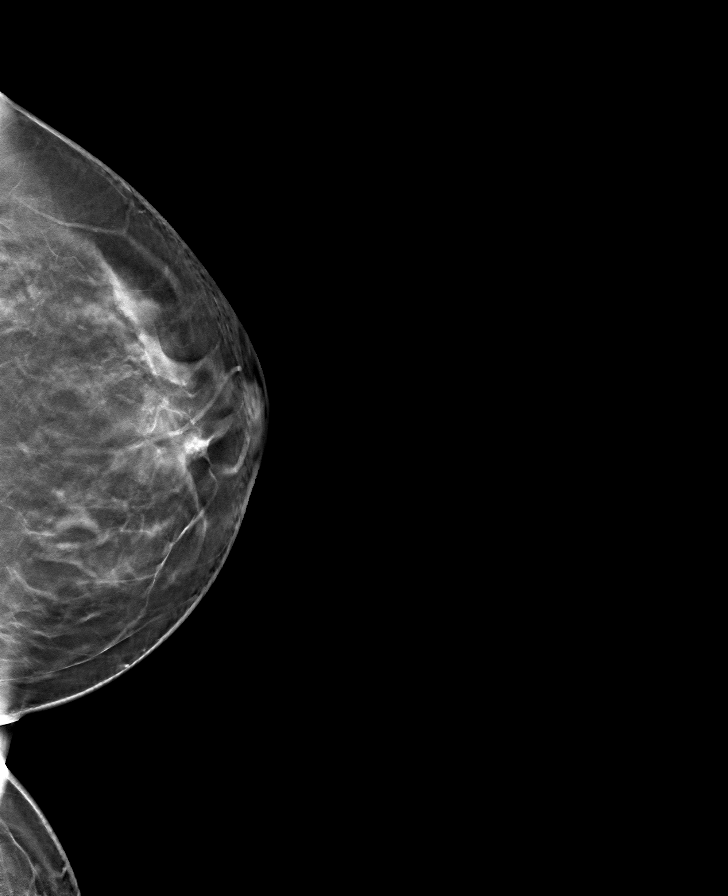

[L MLO tomo · tomo slice 43/86.0]
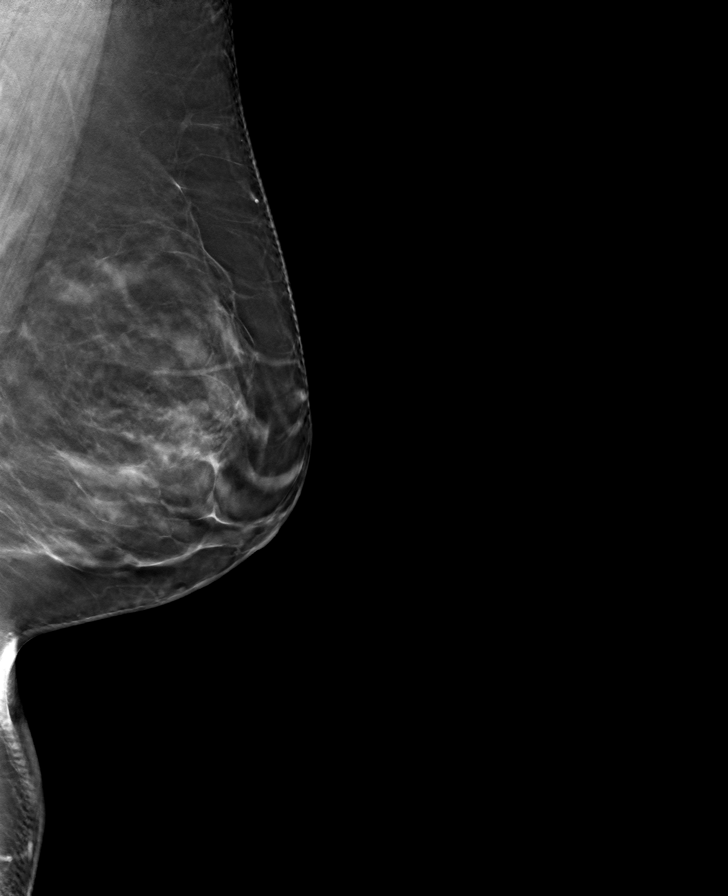

[8 of 24 positions shown; findings below may reference images not displayed]

ACR Breast Density Category c: The breast tissue is heterogeneously
dense, which may obscure small masses
FINDINGS: There are no findings suspicious for malignancy. The images were
evaluated with computer-aided detection.
IMPRESSION: No mammographic evidence of malignancy. A result letter of this
screening mammogram will be mailed directly to the patient.

RECOMMENDATION:
Screening mammogram in one year. (Code:CQ-1-UDH)

BI-RADS CATEGORY  1: Negative.

## 2024-06-10 ENCOUNTER — Other Ambulatory Visit: Payer: Self-pay | Admitting: Family Medicine

## 2024-06-10 DIAGNOSIS — Z1231 Encounter for screening mammogram for malignant neoplasm of breast: Secondary | ICD-10-CM

## 2024-07-27 ENCOUNTER — Ambulatory Visit
Admission: RE | Admit: 2024-07-27 | Discharge: 2024-07-27 | Disposition: A | Discharge status: Home / Self Care | Source: Ambulatory Visit | Attending: Family Medicine | Admitting: Family Medicine

## 2024-07-27 DIAGNOSIS — Z1231 Encounter for screening mammogram for malignant neoplasm of breast: Secondary | ICD-10-CM | POA: Insufficient documentation
